# Patient Record
Sex: Male | Born: 1955 | Race: White | Hispanic: No | Marital: Married | Smoking: Former smoker
Health system: Southern US, Community
[De-identification: ages and names within clinical notes are randomized; demographics above are authoritative.]

---

## 2017-09-05 ENCOUNTER — Encounter: Payer: Self-pay | Admitting: Emergency Medicine

## 2017-09-05 ENCOUNTER — Emergency Department: Payer: 59

## 2017-09-05 ENCOUNTER — Emergency Department
Admission: EM | Admit: 2017-09-05 | Discharge: 2017-09-05 | Disposition: A | Discharge status: Home / Self Care | Payer: 59 | Attending: Emergency Medicine | Admitting: Emergency Medicine

## 2017-09-05 ENCOUNTER — Other Ambulatory Visit: Payer: Self-pay

## 2017-09-05 DIAGNOSIS — K5900 Constipation, unspecified: Secondary | ICD-10-CM | POA: Diagnosis not present

## 2017-09-05 DIAGNOSIS — R1011 Right upper quadrant pain: Secondary | ICD-10-CM | POA: Insufficient documentation

## 2017-09-05 DIAGNOSIS — R197 Diarrhea, unspecified: Secondary | ICD-10-CM | POA: Diagnosis not present

## 2017-09-05 DIAGNOSIS — Z87891 Personal history of nicotine dependence: Secondary | ICD-10-CM | POA: Diagnosis not present

## 2017-09-05 DIAGNOSIS — R110 Nausea: Secondary | ICD-10-CM | POA: Diagnosis not present

## 2017-09-05 DIAGNOSIS — R52 Pain, unspecified: Secondary | ICD-10-CM

## 2017-09-05 LAB — COMPREHENSIVE METABOLIC PANEL
ALK PHOS: 78 U/L (ref 38–126)
ALT: 43 U/L (ref 17–63)
AST: 32 U/L (ref 15–41)
Albumin: 4 g/dL (ref 3.5–5.0)
Anion gap: 5 (ref 5–15)
BILIRUBIN TOTAL: 0.4 mg/dL (ref 0.3–1.2)
BUN: 15 mg/dL (ref 6–20)
CALCIUM: 9.5 mg/dL (ref 8.9–10.3)
CO2: 29 mmol/L (ref 22–32)
CREATININE: 0.91 mg/dL (ref 0.61–1.24)
Chloride: 106 mmol/L (ref 101–111)
GFR calc Af Amer: >60 mL/min (ref >60)
GFR calc non Af Amer: >60 mL/min (ref >60)
GLUCOSE: 118 mg/dL — AB (ref 65–99)
Potassium: 3.7 mmol/L (ref 3.5–5.1)
Sodium: 140 mmol/L (ref 135–145)
TOTAL PROTEIN: 7.1 g/dL (ref 6.5–8.1)

## 2017-09-05 LAB — TROPONIN I: Troponin I: <0.03 ng/mL (ref <0.03)

## 2017-09-05 LAB — URINALYSIS, COMPLETE (UACMP) WITH MICROSCOPIC
BILIRUBIN URINE: NEGATIVE
Bacteria, UA: NONE SEEN
Glucose, UA: 50 mg/dL — AB
Hgb urine dipstick: NEGATIVE
Ketones, ur: NEGATIVE mg/dL
Leukocytes, UA: NEGATIVE
NITRITE: NEGATIVE
PH: 5 (ref 5.0–8.0)
Protein, ur: NEGATIVE mg/dL
SPECIFIC GRAVITY, URINE: 1.024 (ref 1.005–1.030)
SQUAMOUS EPITHELIAL / LPF: NONE SEEN (ref 0–5)

## 2017-09-05 LAB — CBC
HEMATOCRIT: 40.8 % (ref 40.0–52.0)
Hemoglobin: 13.8 g/dL (ref 13.0–18.0)
MCH: 31.2 pg (ref 26.0–34.0)
MCHC: 33.7 g/dL (ref 32.0–36.0)
MCV: 92.6 fL (ref 80.0–100.0)
Platelets: 232 10*3/uL (ref 150–440)
RBC: 4.4 MIL/uL (ref 4.40–5.90)
RDW: 13.6 % (ref 11.5–14.5)
WBC: 8.7 10*3/uL (ref 3.8–10.6)

## 2017-09-05 LAB — LIPASE, BLOOD: Lipase: 25 U/L (ref 11–51)

## 2017-09-05 MED ORDER — IOPAMIDOL (ISOVUE-300) INJECTION 61%
100.0000 mL | Freq: Once | INTRAVENOUS | Status: AC | PRN
  Administered 2017-09-05: 100 mL via INTRAVENOUS

## 2017-09-05 NOTE — ED Triage Notes (Addendum)
Patient ambulatory to triage with steady gait, without difficulty or distress noted; pt reports right upper abd pain x week radiating into back; rx flagyl, augmentin, oxycodone and sennik[place] "[place]bl[place]"; but no u/s performed; denies hx of same; st was constip and now has begun having diarrhea

## 2017-09-05 NOTE — ED Notes (Signed)

## 2017-09-05 NOTE — Discharge Instructions (Addendum)
Continue to take the antibiotics as prescribed until they are done.  Follow-up with your doctor in 2 days.  Return to the emergency room if you have fever, new or worsening abdominal pain.

## 2017-09-05 NOTE — ED Provider Notes (Signed)
[facility] Emergency Department Provider Note  ____________________________________________  Time seen: Approximately 11:31 PM  I have reviewed the triage vital signs and the nursing notes.   HISTORY  Chief Complaint Abdominal Pain   HPI Brad Kemppainen Sr. is a 62 y.o. male with no significant past medical history who presents for evaluation of abdominal pain.  Patient reports pain started 7 days ago, sharp, located in the right upper quadrant, radiating to his right flank, associated with nausea but no vomiting.  Initially the pain was severe.  Patient was at a [employer] base in a [address] and saw a Programmer, multimedia.  They thought he had a gallbladder disease and started him on Flagyl and Augmentin and recommended that he flew back home to be evaluated.  Patient reports that has been trying to fly back home for a week and was able to get here today.  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  He reports that the pain is persistent but is currently much milder at 3 out of 10.  He denies any prior history of kidney stones, prior abdominal surgeries, dysuria or hematuria, fever or chills.  He reports constipation however since being started on antibiotics he is now having diarrhea.  He has been on antibiotics for 3 days. He does take NSAIDs regularly for chronic arthritis pain. No melena.  No personal or family history of blood clots, no recent travel or immobilization, no leg pain or swelling, no hemoptysis, no exogenous hormones.  PMH HLD Arthritis  Allergies Patient has no known allergies.  FH No h/o colon cancer  Social History Social History   Tobacco Use  . Smoking status: Former Games developer  . Smokeless tobacco: Never Used  Substance Use Topics  . Alcohol use: Yes    Comment: weekends  . Drug use: Not on file    Review of Systems  Constitutional: Negative for fever. Eyes: Negative for visual changes. ENT: Negative for  sore throat. Neck: No neck pain  Cardiovascular: Negative for chest pain. Respiratory: Negative for shortness of breath. Gastrointestinal: + RUQ abdominal pain and nausea. No vomiting or diarrhea. Genitourinary: Negative for dysuria. Musculoskeletal: Negative for back pain. Skin: Negative for rash. Neurological: Negative for headaches, weakness or numbness. Psych: No SI or HI  ____________________________________________   PHYSICAL EXAM:  VITAL SIGNS: ED Triage Vitals  Enc Vitals Group     BP 09/05/17 1909 127/85     Pulse Rate 09/05/17 1909 83     Resp 09/05/17 1909 18     Temp 09/05/17 1909 98.1 F (36.7 C)     Temp Source 09/05/17 1909 Oral     SpO2 09/05/17 1909 96 %     Weight 09/05/17 1909 210 lb (95.3 kg)     Height 09/05/17 1909 5\' 10"  (1.778 m)     Head Circumference --      Peak Flow --      Pain Score 09/05/17 1924 2     Pain Loc --      Pain Edu? --      Excl. in GC? --     Constitutional: Alert and oriented. Well appearing and in no apparent distress. HEENT:      Head: Normocephalic and atraumatic.         Eyes: Conjunctivae are normal. Sclera is non-icteric.       Mouth/Throat: Mucous membranes are moist.       Neck: Supple with no signs  of meningismus. Cardiovascular: Regular rate and rhythm. No murmurs, gallops, or rubs. 2+ symmetrical distal pulses are present in all extremities. No JVD. Respiratory: Normal respiratory effort. Lungs are clear to auscultation bilaterally. No wheezes, crackles, or rhonchi.  Gastrointestinal: Soft, mild epigastric and right upper quadrant tenderness with negative Murphy sign, and non distended with positive bowel sounds. No rebound or guarding. Genitourinary: No CVA tenderness. Musculoskeletal: Nontender with normal range of motion in all extremities. No edema, cyanosis, or erythema of extremities. Neurologic: Normal speech and language. Face is symmetric. Moving all extremities. No gross focal neurologic deficits are  appreciated. Skin: Skin is warm, dry and intact. No rash noted. Psychiatric: Mood and affect are normal. Speech and behavior are normal.  ____________________________________________   LABS (all labs ordered are listed, but only abnormal results are displayed)  Labs Reviewed  COMPREHENSIVE METABOLIC PANEL - Abnormal; Notable for the following components:      Result Value   Glucose, Bld 118 (*)    All other components within normal limits  URINALYSIS, COMPLETE (UACMP) WITH MICROSCOPIC - Abnormal; Notable for the following components:   Color, Urine YELLOW (*)    APPearance CLEAR (*)    Glucose, UA 50 (*)    All other components within normal limits  LIPASE, BLOOD  CBC  TROPONIN I   ____________________________________________  EKG  ED ECG REPORT I, Nita Sickle, the attending physician, personally viewed and interpreted this ECG.  Normal sinus rhythm, rate of 78, normal intervals, normal axis, no ST elevations or depressions, T wave flattening all lateral and inferior leads.  No prior for comparison. ____________________________________________  RADIOLOGY  I have personally reviewed the images performed during this visit and I agree with the Radiologist's read.   Interpretation by Radiologist:  Ct Abdomen Pelvis W Contrast  Result Date: 09/05/2017 CLINICAL DATA:  Right upper abdominal pain radiating the back. EXAM: CT ABDOMEN AND PELVIS WITH CONTRAST TECHNIQUE: Multidetector CT imaging of the abdomen and pelvis was performed using the standard protocol following bolus administration of intravenous contrast. CONTRAST:  ISOVUE-300 IOPAMIDOL (ISOVUE-300) INJECTION 61% COMPARISON:  None. FINDINGS: Lower chest: No acute abnormality. Hepatobiliary: Hepatic steatosis. No biliary dilatation or enhancing mass. Normal gallbladder without stones. Pancreas: Normal Spleen: Normal Adrenals/Urinary Tract: Normal bilateral adrenal glands. Symmetric cortical enhancement of the  kidneys without obstructive uropathy. No enhancing renal lesions. Unremarkable bladder for degree of distention. Stomach/Bowel: Decompressed stomach. Normal small bowel rotation without obstruction or inflammation. Normal appendix. Moderate fecal retention within the colon with colonic diverticulosis most marked affecting the sigmoid colon with circular muscle hypertrophy. No definite evidence of acute diverticulitis or colitis. Vascular/Lymphatic: Aortic atherosclerosis without aneurysm or dissection. Patent celiac, SMA and both renal arteries with atherosclerosis at the origin of the left renal artery. IMA is patent. No portal venous or splenic vein thrombosis. No lymphadenopathy. Reproductive: Normal size prostate and seminal vesicles. Other: No free air nor free fluid. Musculoskeletal: No aggressive nor worrisome osseous lesions. IMPRESSION: 1. Hepatic steatosis. 2. Colonic diverticulosis without acute diverticulitis. 3. Aortic atherosclerosis without aneurysmal dilatation or dissection. Electronically Signed   By: Tollie Eth M.D.   On: 09/05/2017 23:15   US Abdomen Limited Ruq  Result Date: 09/05/2017 CLINICAL DATA:  Pain for 1 week. EXAM: ULTRASOUND ABDOMEN LIMITED RIGHT UPPER QUADRANT COMPARISON:  None. FINDINGS: Gallbladder: No gallstones or wall thickening visualized. No sonographic Murphy sign noted by sonographer. Common bile duct: Diameter: 2 mm Liver: Increased density with no focal mass. Portal vein is patent on color Doppler  imaging with normal direction of blood flow towards the liver. IMPRESSION: 1. Increased density in the liver is likely due to hepatic steatosis. No other abnormalities. Electronically Signed   By: Gerome Samavid  Williams III M.D   On: 09/05/2017 20:37     ____________________________________________   PROCEDURES  Procedure(s) performed: None Procedures Critical Care performed:  None ____________________________________________   INITIAL IMPRESSION / ASSESSMENT AND PLAN  / ED COURSE   62 y.o. male with no significant past medical history who presents for evaluation of abdominal pain.  Patient has been on antibiotics for 3 days after being diagnosed with possible cholecystitis [place] with no imaging.  He reports that the pain is improved but is still persistent.  Does not worse after meals.  No vomiting or fever.  Patient is PERC negative.  His abdomen is soft with tenderness to palpation in the right upper quadrant/epigastric region, labs are within normal limits.  Right upper quadrant ultrasound showed normal gallbladder.  Patient was sent for CT scan which shows diverticulosis with no evidence of diverticulitis.  Since patient has been on Augmentin and Flagyl for 3 days and the pain is improving I believe the patient may have had a flare of diverticulitis which is now appropriately being treated.  Recommend he continues to take the antibiotics as prescribed.  Discussed return precautions.  At this time with no evidence of acute inflammation, normal labs, normal vitals patient is medically cleared to return to his days.      As part of my medical decision making, I reviewed the following data within the electronic MEDICAL RECORD NUMBER Nursing notes reviewed and incorporated, Labs reviewed , EKG interpreted , Radiograph reviewed , Notes from prior ED visits and [place] Controlled Substance Database    Pertinent labs & imaging results that were available during my care of the patient were reviewed by me and considered in my medical decision making (see chart for details).    ____________________________________________   FINAL CLINICAL IMPRESSION(S) / ED DIAGNOSES  Final diagnoses:  Pain  Right upper quadrant abdominal pain      NEW MEDICATIONS STARTED DURING THIS VISIT:  ED Discharge Orders    None       Note:  This document was prepared using Dragon voice recognition software and may include unintentional dictation errors.    Don PerkingVeronese,  [place], MD 09/05/17 [phone]

## 2018-09-27 IMAGING — CT CT ABD-PELV W/ CM
2 of 5 series · 16 of 46 positions shown, 18 images · IV contrast (iopamidol)
Comparison: None.

CLINICAL DATA: Right upper abdominal pain radiating the back.

EXAM:
CT ABDOMEN AND PELVIS WITH CONTRAST
TECHNIQUE: Multidetector CT imaging of the abdomen and pelvis was performed
using the standard protocol following bolus administration of
intravenous contrast.
CONTRAST:  100mL GGDAPW-DHH IOPAMIDOL (GGDAPW-DHH) INJECTION 61%

[Series 2: axial st · axial · 0.80mm/px · z∈[-815,-415]mm · 13 of 90 slices shown, 15 images]
[im 5/90  soft-tissue]
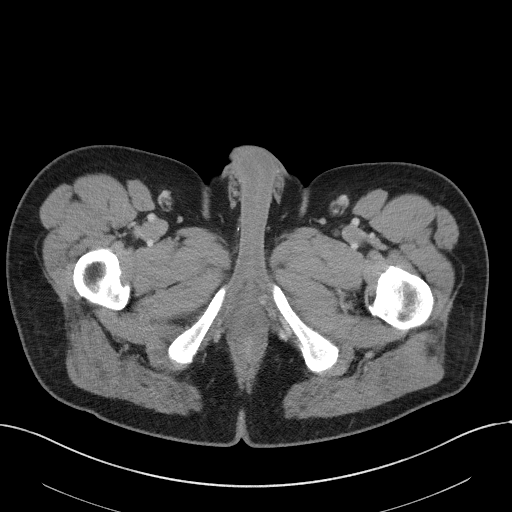
[im 5/90  bone]
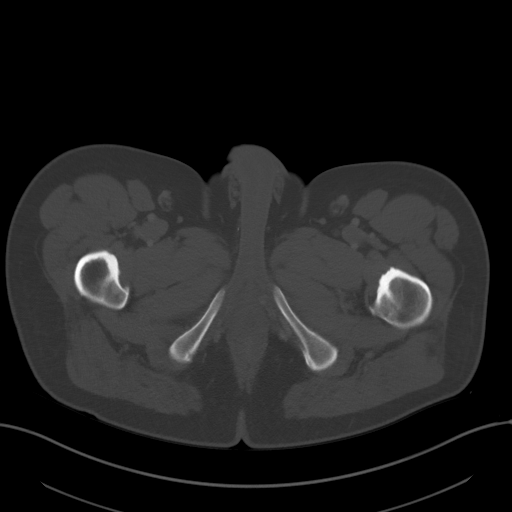
[im 15/90  soft-tissue]
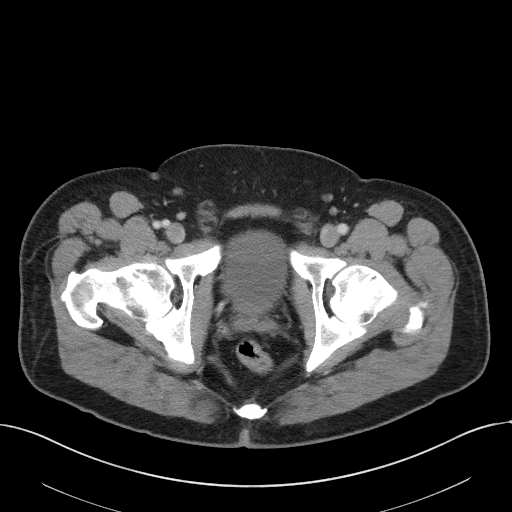
[im 19/90  soft-tissue]
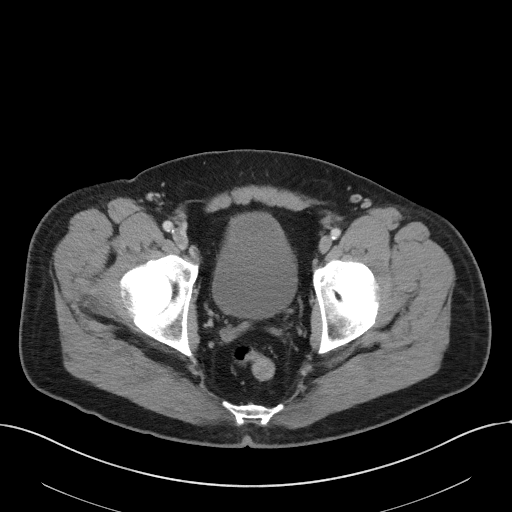
[im 24/90  soft-tissue]
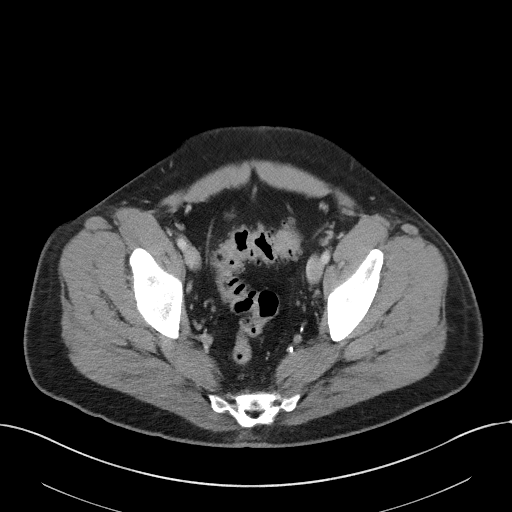
[im 33/90  soft-tissue]
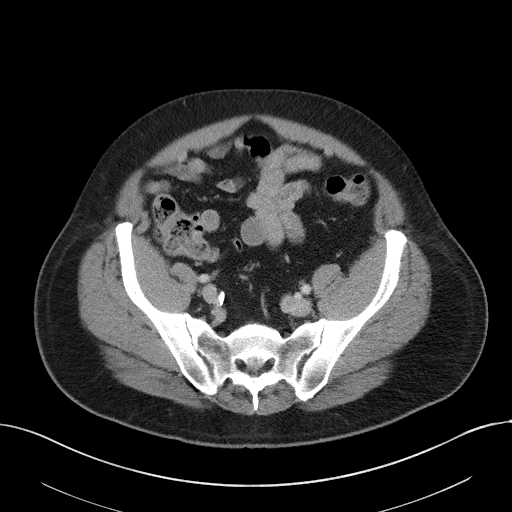
[im 38/90  soft-tissue]
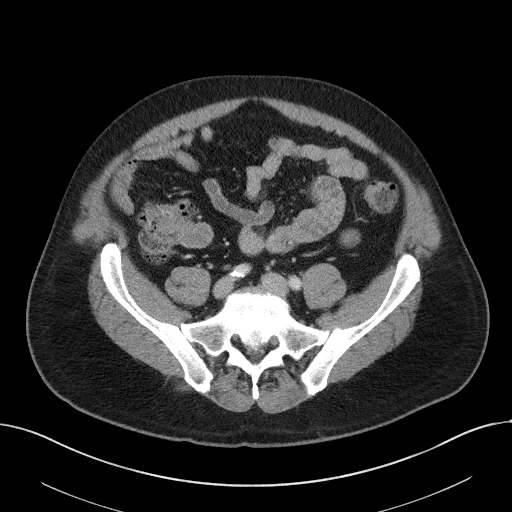
[im 47/90  soft-tissue]
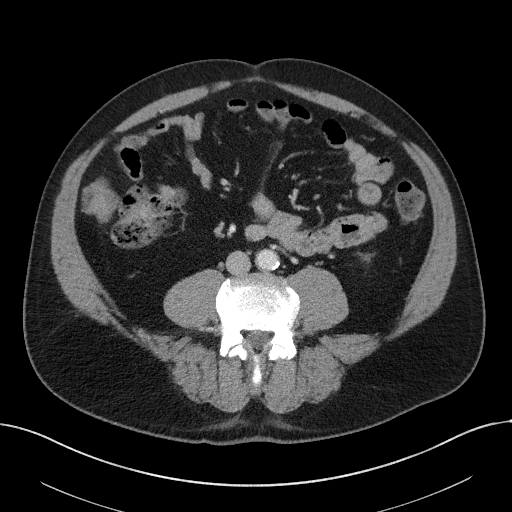
[im 52/90  soft-tissue]
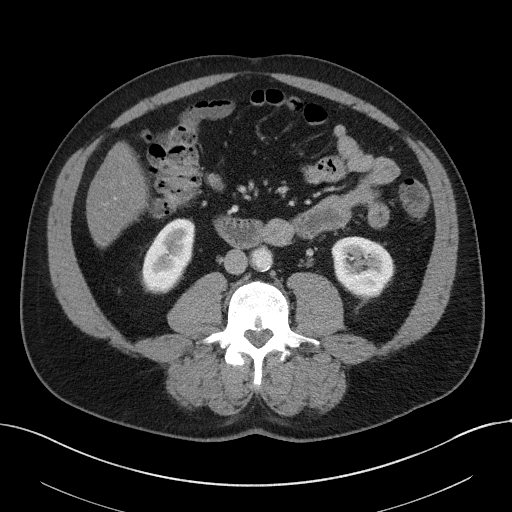
[im 57/90  soft-tissue]
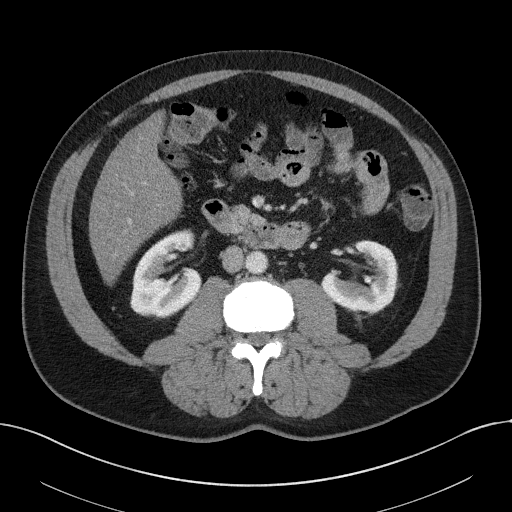
[im 57/90  bone]
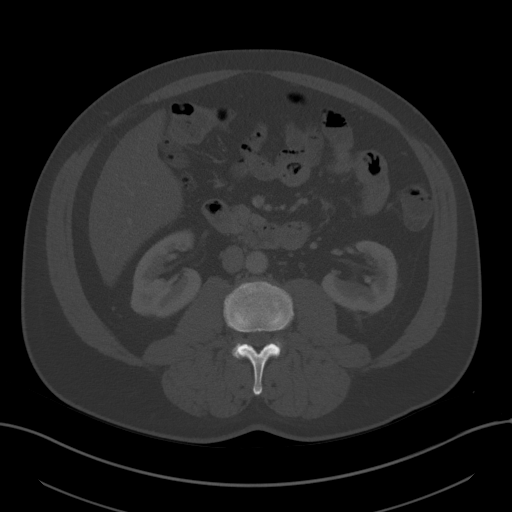
[im 66/90  soft-tissue]
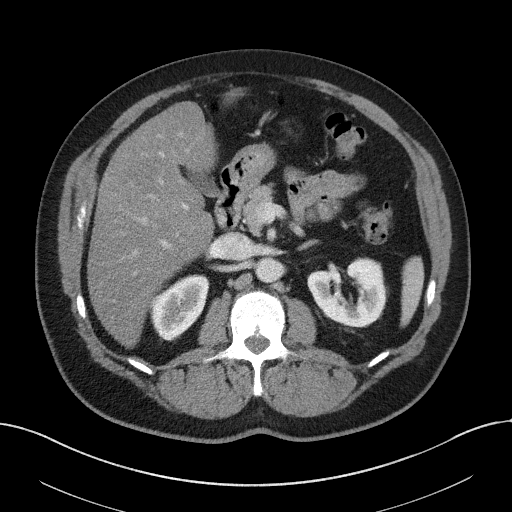
[im 71/90  soft-tissue]
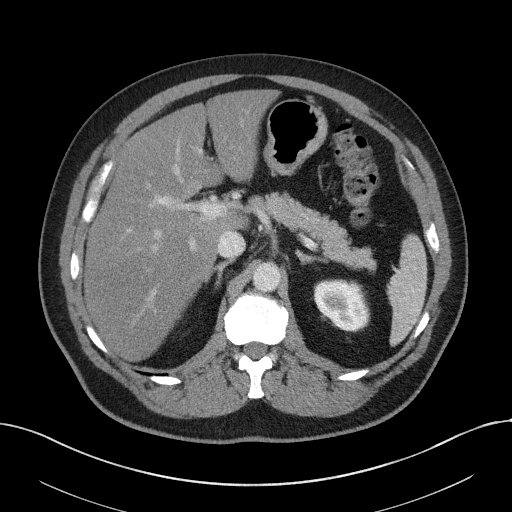
[im 75/90  soft-tissue]
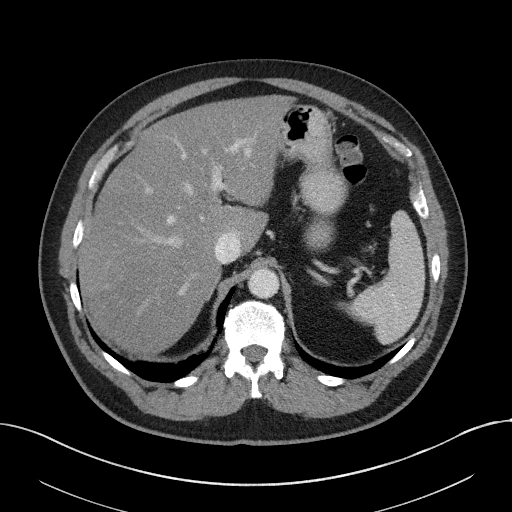
[im 85/90  soft-tissue]
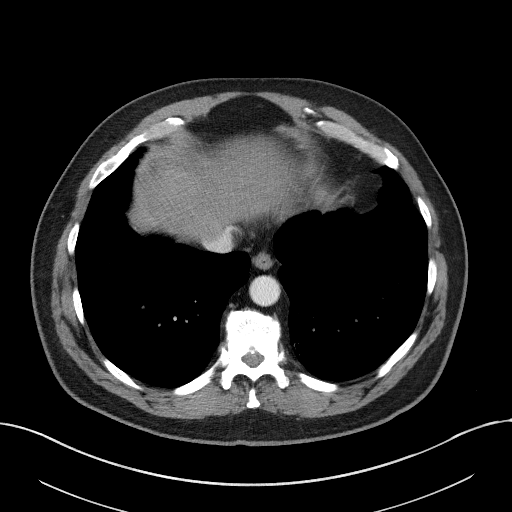

[Series 5: coronal st · coronal · 0.74mm/px · 3 of 102 slices shown]
[im 34/102  soft-tissue]
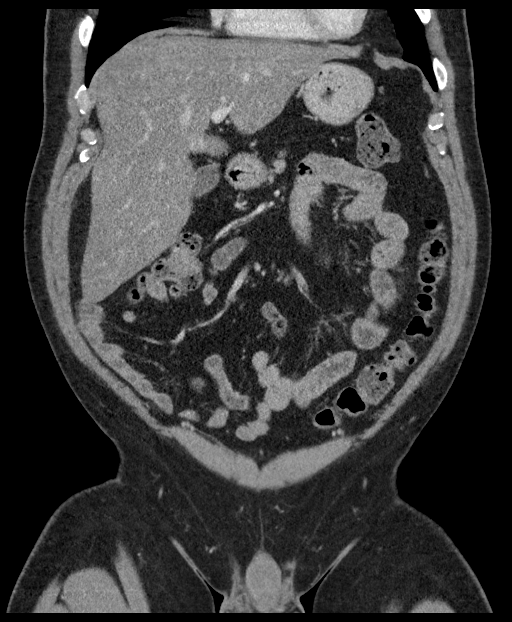
[im 45/102  soft-tissue]
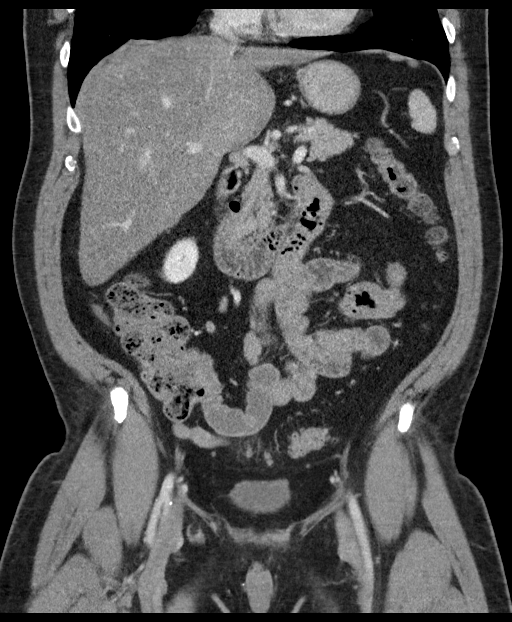
[im 57/102  soft-tissue]
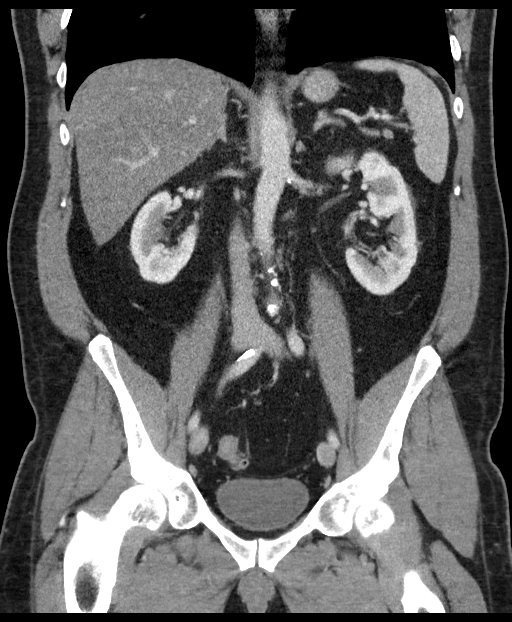

[16 of 46 positions shown; findings below may reference images not displayed]

FINDINGS: Lower chest: No acute abnormality.

Hepatobiliary: Hepatic steatosis. No biliary dilatation or enhancing
mass. Normal gallbladder without stones.

Pancreas: Normal

Spleen: Normal

Adrenals/Urinary Tract: Normal bilateral adrenal glands. Symmetric
cortical enhancement of the kidneys without obstructive uropathy. No
enhancing renal lesions. Unremarkable bladder for degree of
distention.

Stomach/Bowel: Decompressed stomach. Normal small bowel rotation
without obstruction or inflammation. Normal appendix. Moderate fecal
retention within the colon with colonic diverticulosis most marked
affecting the sigmoid colon with circular muscle hypertrophy. No
definite evidence of acute diverticulitis or colitis.

Vascular/Lymphatic: Aortic atherosclerosis without aneurysm or
dissection. Patent celiac, SMA and both renal arteries with
atherosclerosis at the origin of the left renal artery. IMA is
patent. No portal venous or splenic vein thrombosis. No
lymphadenopathy.

Reproductive: Normal size prostate and seminal vesicles.

Other: No free air nor free fluid.

Musculoskeletal: No aggressive nor worrisome osseous lesions.
IMPRESSION: 1. Hepatic steatosis.
2. Colonic diverticulosis without acute diverticulitis.
3. Aortic atherosclerosis without aneurysmal dilatation or
dissection.

## 2019-03-13 IMAGING — US US ABDOMEN LIMITED
1 series · 14 of 25 positions shown · non-contrast
Comparison: None.

CLINICAL DATA: Pain for 1 week.

EXAM:
ULTRASOUND ABDOMEN LIMITED RIGHT UPPER QUADRANT

[Series 1: us abdomen limited · 0.20mm/px · 14 of 47 slices shown]
[im 1/47]
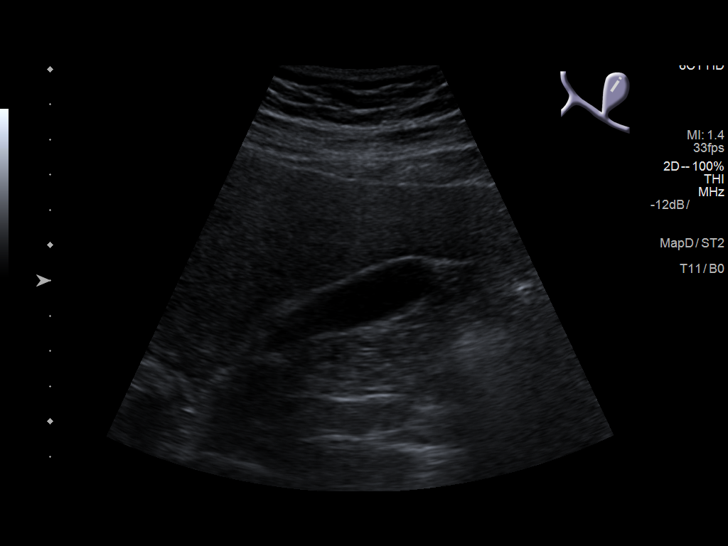
[im 4/47]
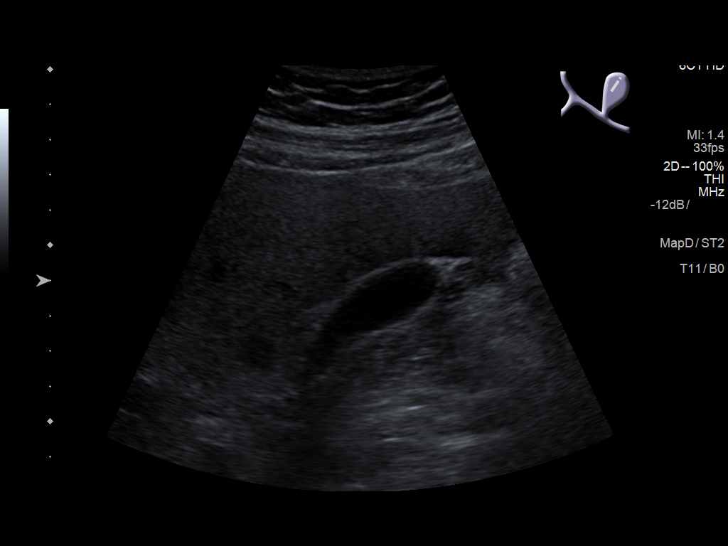
[im 8/47]
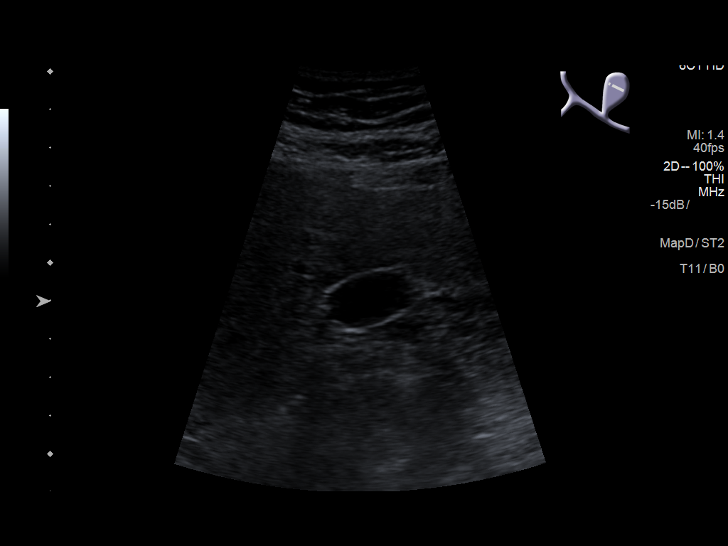
[im 12/47]
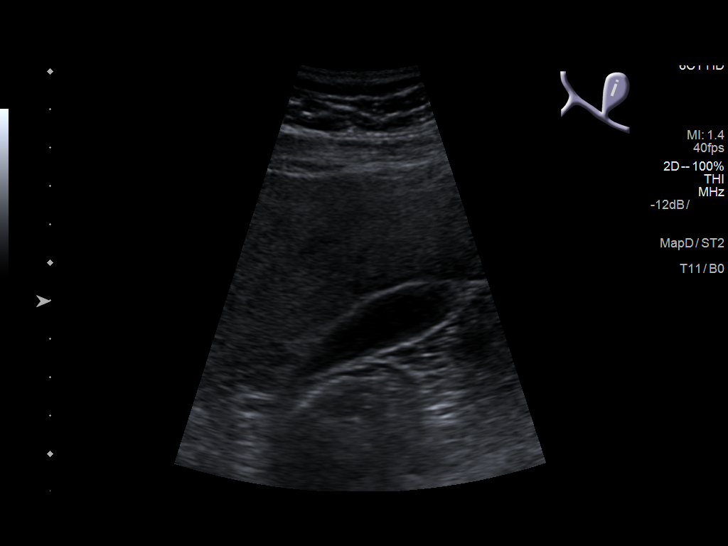
[im 16/47]
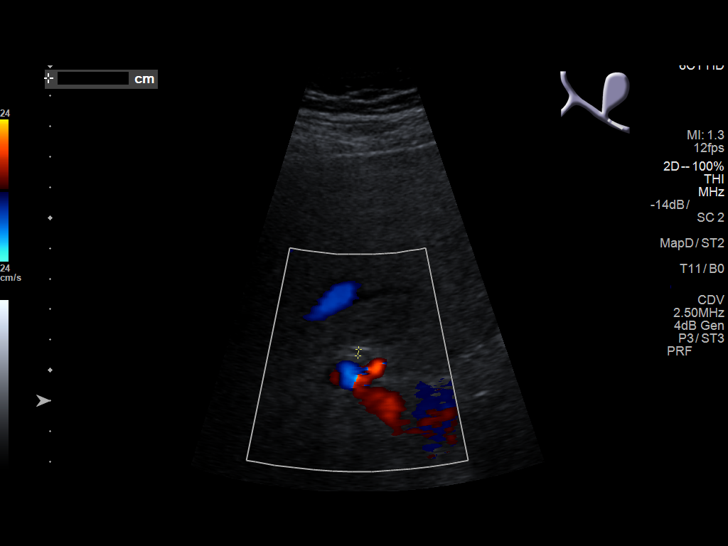
[im 18/47]
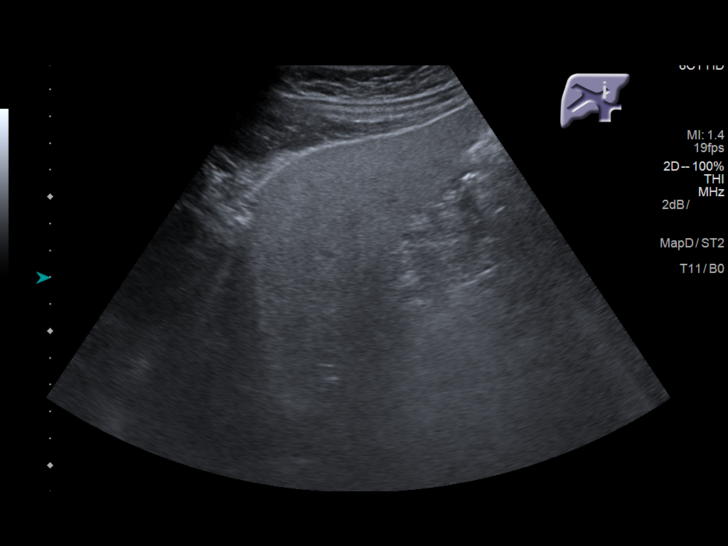
[im 22/47]
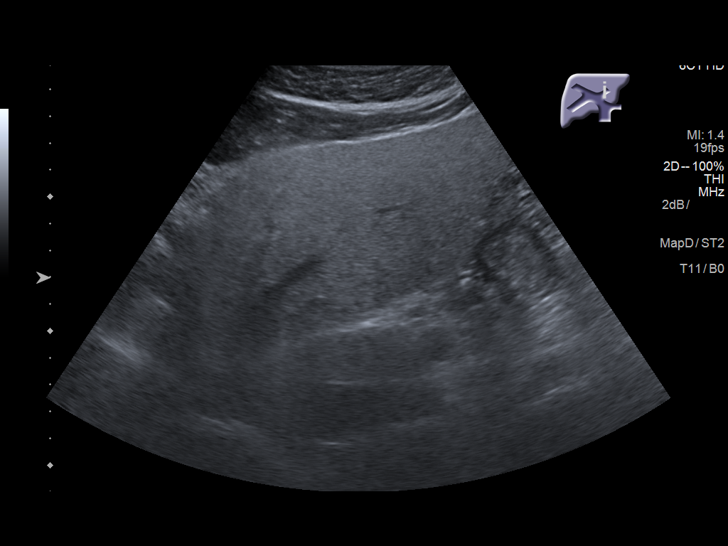
[im 25/47]
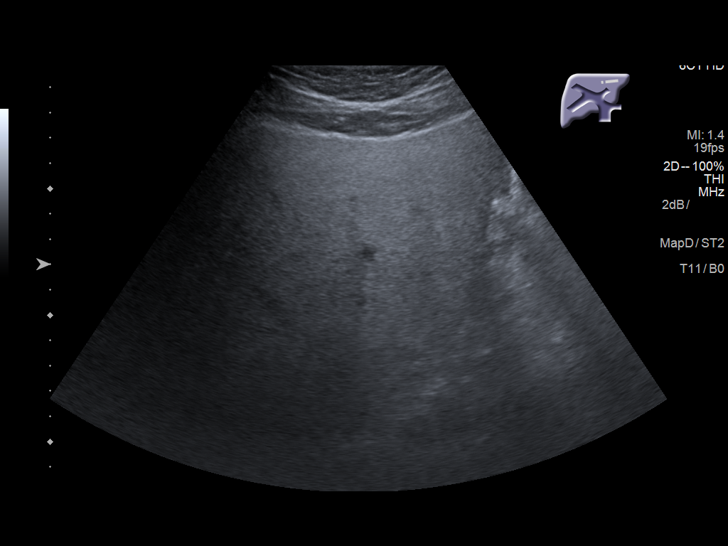
[im 29/47]
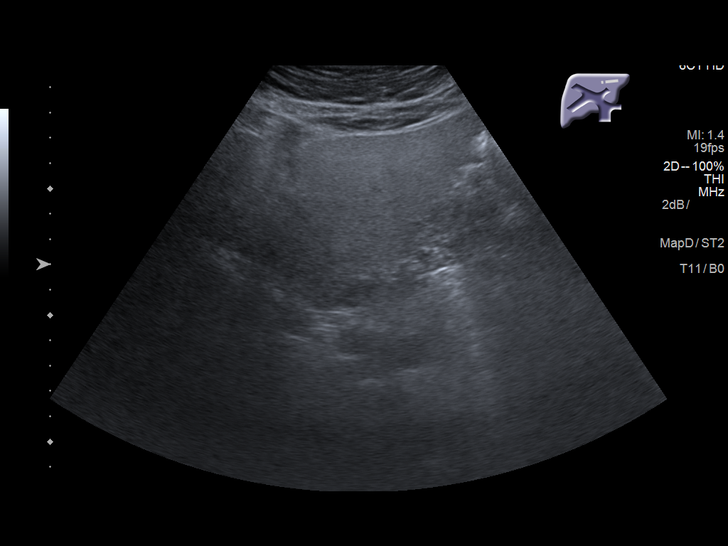
[im 31/47]
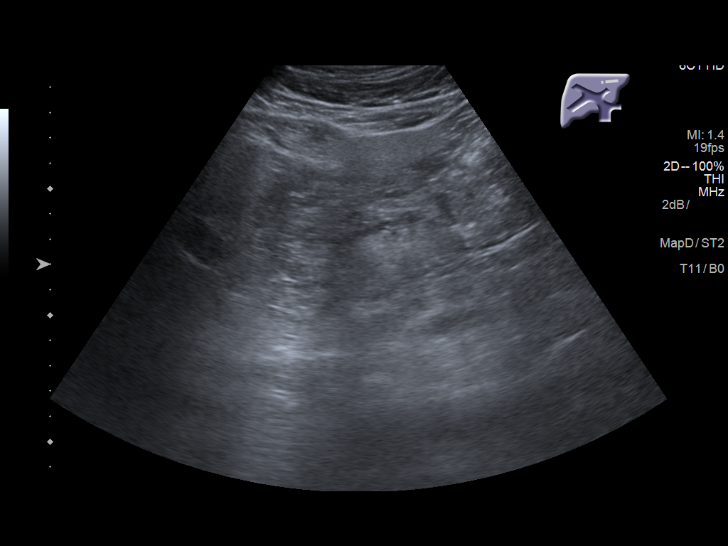
[im 35/47]
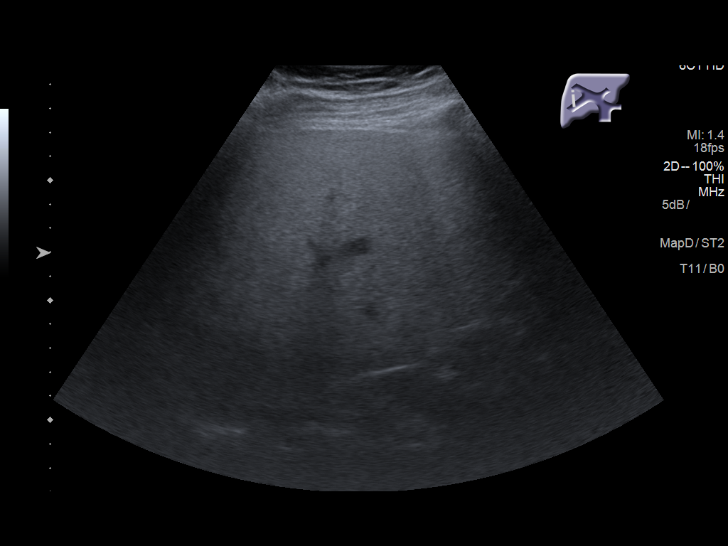
[im 39/47]
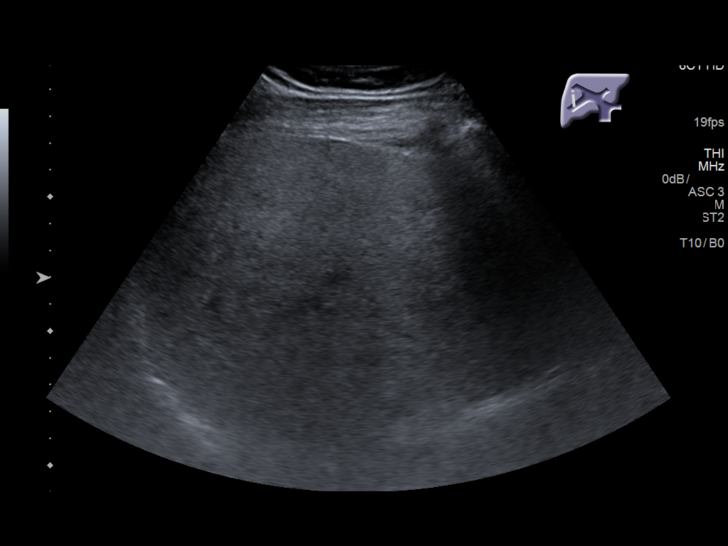
[im 43/47]
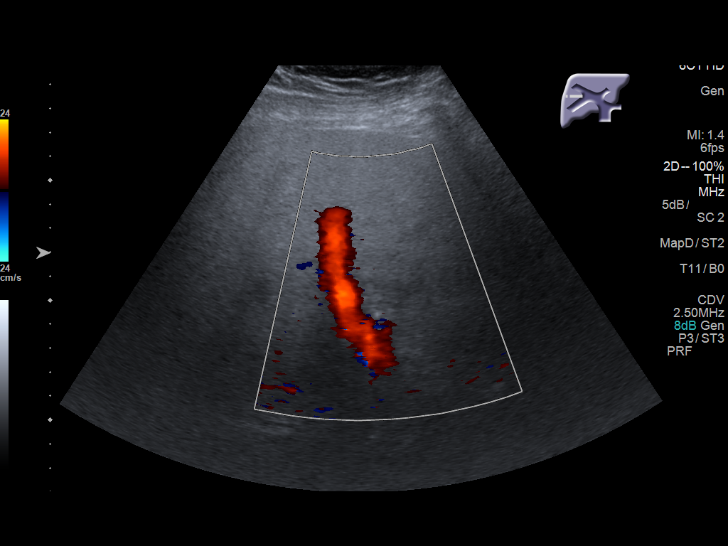
[im 47/47]
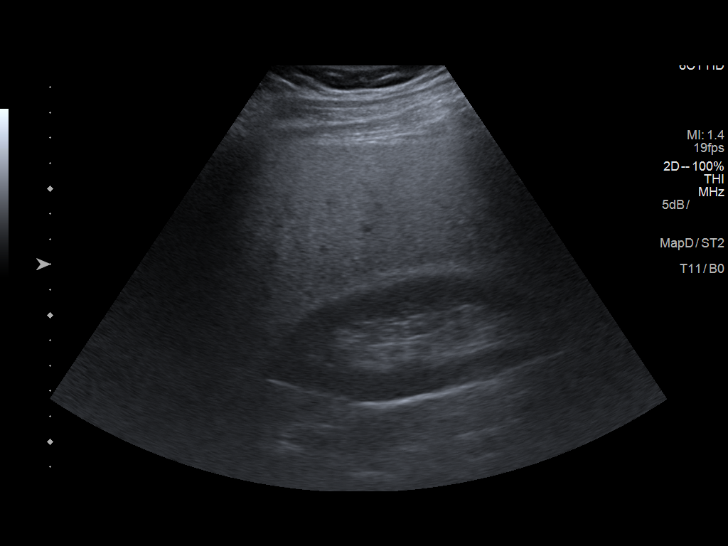

[14 of 25 positions shown; findings below may reference images not displayed]

FINDINGS: Gallbladder:

No gallstones or wall thickening visualized. No sonographic Murphy
sign noted by sonographer.

Common bile duct:

Diameter: 2 mm

Liver:

Increased density with no focal mass. Portal vein is patent on color
Doppler imaging with normal direction of blood flow towards the
liver.
IMPRESSION: 1. Increased density in the liver is likely due to hepatic
steatosis. No other abnormalities.
